# Patient Record
Sex: Male | Born: 1965 | Race: Black or African American | Hispanic: No | Smoking: Never smoker
Health system: Southern US, Community
[De-identification: ages and names within clinical notes are randomized; demographics above are authoritative.]

---

## 2008-04-05 ENCOUNTER — Emergency Department (HOSPITAL_COMMUNITY): Admission: EM | Admit: 2008-04-05 | Discharge: 2008-04-05 | Payer: Self-pay | Admitting: Emergency Medicine

## 2009-01-13 ENCOUNTER — Emergency Department (HOSPITAL_COMMUNITY): Admission: EM | Admit: 2009-01-13 | Discharge: 2009-01-14 | Payer: Self-pay | Admitting: *Deleted

## 2009-08-22 ENCOUNTER — Emergency Department (HOSPITAL_COMMUNITY): Admission: EM | Admit: 2009-08-22 | Discharge: 2009-08-22 | Payer: Self-pay | Admitting: Emergency Medicine

## 2009-11-09 ENCOUNTER — Emergency Department (HOSPITAL_COMMUNITY): Admission: EM | Admit: 2009-11-09 | Discharge: 2009-11-09 | Payer: Self-pay | Admitting: Emergency Medicine

## 2010-01-20 ENCOUNTER — Encounter (HOSPITAL_COMMUNITY): Admission: RE | Admit: 2010-01-20 | Discharge: 2010-02-19 | Payer: Self-pay | Admitting: Preventative Medicine

## 2010-03-29 IMAGING — CR DG NECK SOFT TISSUE
1 series · 1 of 1 positions shown · non-contrast
Comparison: None

CLINICAL DATA: Sensation of object within throat for several weeks.

NECK SOFT TISSUES - 1+ VIEW

[w soft tissue neck]
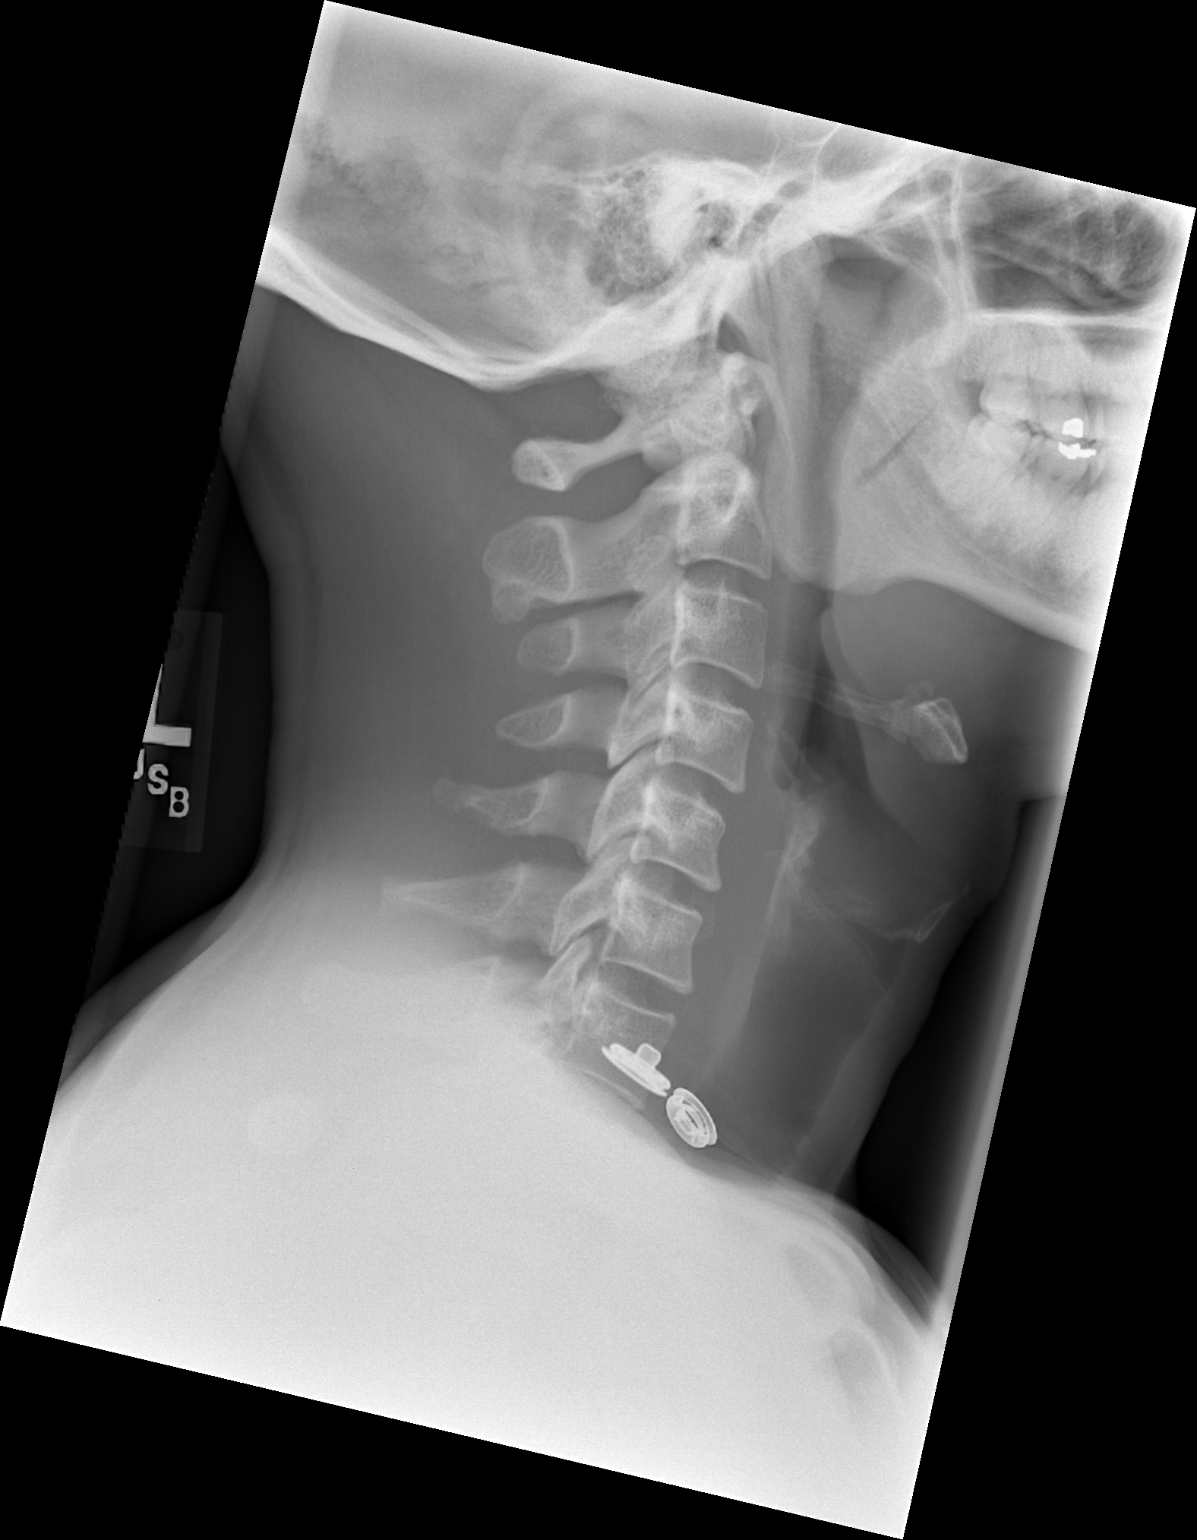

[1 of 1 positions shown; findings below may reference images not displayed]

FINDINGS: No radiopaque foreign body is identified.  The oropharynx
appears narrowed, although this may reflect transient change
secondary to swallowing.  The prevertebral soft tissues are within
normal limits.  The proximal trachea is unremarkable.  No definite
soft tissue abnormalities are seen.

The visualized cervical spine is unremarkable in appearance.  The
visualized paranasal sinuses and mastoid air cells are well-
aerated.
IMPRESSION: No radiopaque foreign bodies seen; apparent narrowing of the
oropharynx may be transient in nature.

## 2011-02-15 LAB — URINALYSIS, ROUTINE W REFLEX MICROSCOPIC
Bilirubin Urine: NEGATIVE
Glucose, UA: NEGATIVE mg/dL
Ketones, ur: NEGATIVE mg/dL
Protein, ur: NEGATIVE mg/dL
Specific Gravity, Urine: 1.019 (ref 1.005–1.030)
pH: 7.5 (ref 5.0–8.0)

## 2011-02-15 LAB — GC/CHLAMYDIA PROBE AMP, GENITAL: GC Probe Amp, Genital: POSITIVE — AB

## 2011-02-15 LAB — URINE MICROSCOPIC-ADD ON

## 2013-02-20 ENCOUNTER — Encounter (HOSPITAL_COMMUNITY): Payer: Self-pay | Admitting: Emergency Medicine

## 2013-02-20 ENCOUNTER — Emergency Department (INDEPENDENT_AMBULATORY_CARE_PROVIDER_SITE_OTHER)
Admission: EM | Admit: 2013-02-20 | Discharge: 2013-02-20 | Disposition: A | Discharge status: Home / Self Care | Payer: Commercial Managed Care - PPO | Source: Home / Self Care | Attending: Emergency Medicine | Admitting: Emergency Medicine

## 2013-02-20 DIAGNOSIS — R51 Headache: Secondary | ICD-10-CM

## 2013-02-20 MED ORDER — DIPHENHYDRAMINE HCL 50 MG/ML IJ SOLN
50.0000 mg | Freq: Once | INTRAMUSCULAR | Status: AC
  Administered 2013-02-20: 50 mg via INTRAMUSCULAR

## 2013-02-20 MED ORDER — KETOROLAC TROMETHAMINE 30 MG/ML IJ SOLN
30.0000 mg | Freq: Once | INTRAMUSCULAR | Status: AC
  Administered 2013-02-20: 30 mg via INTRAMUSCULAR

## 2013-02-20 MED ORDER — DIPHENHYDRAMINE HCL 50 MG/ML IJ SOLN
INTRAMUSCULAR | Status: AC
  Filled 2013-02-20: qty 1

## 2013-02-20 MED ORDER — KETOROLAC TROMETHAMINE 30 MG/ML IJ SOLN
INTRAMUSCULAR | Status: AC
  Filled 2013-02-20: qty 1

## 2013-02-20 MED ORDER — DICLOFENAC SODIUM 1 % TD GEL: TRANSDERMAL | Status: AC

## 2013-02-20 NOTE — ED Provider Notes (Signed)
History     CSN: [id number]  Arrival date & time 02/20/13  1722   First MD Initiated Contact with Patient 02/20/13 1729      Chief Complaint  Patient presents with  . Headache    (Consider location/radiation/quality/duration/timing/severity/associated sxs/prior treatment) HPI Comments: 47 year old African male complaining of headache located in the by frontal areas just above the temples. For the past 3 days it has been persistent and he rates it 9/10. He has headaches at least 2-3 times per my for the past 10 years. He has never been medically evaluated. Headaches are worse if he does not eat breakfast, does not sleep well or if her situation bothersome. He denies focal paresthesias or weakness. Denies problems with vision, speech, hearing or swallowing. He has had a cough for the past couple of days associated with mild PND.   History reviewed. No pertinent past medical history.  History reviewed. No pertinent past surgical history.  No family history on file.  History  Substance Use Topics  . Smoking status: Never Smoker   . Smokeless tobacco: Not on file  . Alcohol Use: No      Review of Systems  Constitutional: Negative.   HENT: Positive for postnasal drip. Negative for hearing loss, ear pain, sore throat, rhinorrhea, mouth sores, trouble swallowing and neck pain.   Eyes: Negative.   Respiratory: Negative.   Cardiovascular: Negative.   Gastrointestinal: Negative.   Musculoskeletal: Negative.   Skin: Negative.   Neurological: Positive for headaches. Negative for tremors, seizures, syncope, facial asymmetry, speech difficulty, weakness and numbness.    Allergies  Review of patient's allergies indicates no known allergies.  Home Medications   Current Outpatient Rx  Name  Route  Sig  Dispense  Refill  . acetaminophen (TYLENOL) 325 MG tablet   Oral   Take 650 mg by mouth every 6 (six) hours as needed for pain.         Marland Kitchen diclofenac sodium (VOLTAREN) 1 % GEL     Apply small amount of gel to the areas of headache pain.   100 g   0     BP 121/85  Pulse 67  Temp(Src) 98.4 F (36.9 C) (Oral)  Resp 19  SpO2 99%  Physical Exam  Nursing note and vitals reviewed. Constitutional: He is oriented to person, place, and time. He appears well-developed and well-nourished. No distress.  HENT:  Head: Normocephalic and atraumatic.  Bilateral TMs normal Oropharynx without erythema or exudates. Normal swallowing reflex.  Eyes: Conjunctivae and EOM are normal. Pupils are equal, round, and reactive to light.  Neck: Normal range of motion. Neck supple.  Cardiovascular: Normal rate, regular rhythm and normal heart sounds.   Pulmonary/Chest: Effort normal and breath sounds normal. No respiratory distress. He has no wheezes.  Musculoskeletal: Normal range of motion. He exhibits no edema and no tenderness.  Lymphadenopathy:    He has no cervical adenopathy.  Neurological: He is alert and oriented to person, place, and time. He displays no tremor. No cranial nerve deficit or sensory deficit. He exhibits normal muscle tone. He displays a negative Romberg sign. Coordination and gait normal. GCS eye subscore is 4. GCS verbal subscore is 5. GCS motor subscore is 6.  Skin: Skin is warm and dry.  Psychiatric: He has a normal mood and affect.    ED Course  Procedures (including critical care time)  Labs Reviewed - No data to display No results found.   1. Headache  MDM  The patient's physical exam is unremarkable. There is tenderness over the bifrontal areas of the scalp. I suspect this may be a tension type headache. There are no neurologic signs, symptoms or deficits. Toradol 30 mg IM and Benadryl 50 mg IM. Diclofenac gel apply small amount to the area of pain on the fore head during headache. Needs to locate a primary care physician for a evaluation. May use [employer] to locate a local physician. Per any new symptoms problems or worsening may return  or go to emergency department.        Hayden Rasmussen, NP 02/20/13 [phone]

## 2013-02-20 NOTE — ED Notes (Signed)
Headache for 3 days, history of the same.  Reports cough and throat congestion, not a sore throat, but unable to move congestion.  Denies ear pain.  No fever

## 2013-02-20 NOTE — ED Provider Notes (Signed)
Medical screening examination/treatment/procedure(s) were performed by non-physician practitioner and as supervising physician I was immediately available for consultation/collaboration.  Leslee Home, M.D.  Reuben Likes, MD 02/20/13 [phone]

## 2013-02-21 ENCOUNTER — Telehealth (HOSPITAL_COMMUNITY): Payer: Self-pay | Admitting: Emergency Medicine

## 2013-02-21 NOTE — ED Notes (Signed)
Pharmacy called in regards to Rx Voltaren Gel; needed grams pt is to apply and how freq Per Hayden Rasmussen, NP... Pt to apply 1-2 grams prn headache

## 2013-02-21 NOTE — ED Notes (Signed)
Pharmacy called stating Voltaren gel needs pre-authorization.  We can not perform that service here.  Chart reviewed by Hayden Rasmussen NP.   He stated patient could try OTC Salonpas.  Pharmacy made aware

## 2014-08-16 ENCOUNTER — Encounter (HOSPITAL_COMMUNITY): Payer: Self-pay | Admitting: Emergency Medicine

## 2014-08-16 ENCOUNTER — Emergency Department (INDEPENDENT_AMBULATORY_CARE_PROVIDER_SITE_OTHER)
Admission: EM | Admit: 2014-08-16 | Discharge: 2014-08-16 | Disposition: A | Discharge status: Home / Self Care | Payer: Self-pay | Source: Home / Self Care | Attending: Family Medicine | Admitting: Family Medicine

## 2014-08-16 DIAGNOSIS — M549 Dorsalgia, unspecified: Secondary | ICD-10-CM

## 2014-08-16 DIAGNOSIS — Z041 Encounter for examination and observation following transport accident: Secondary | ICD-10-CM

## 2014-08-16 MED ORDER — CYCLOBENZAPRINE HCL 5 MG PO TABS: 5.0000 mg | ORAL_TABLET | Freq: Three times a day (TID) | ORAL | Status: AC | PRN
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
## 2020-05-18 ENCOUNTER — Ambulatory Visit: Payer: Commercial Managed Care - PPO | Attending: Internal Medicine

## 2020-05-18 DIAGNOSIS — Z20822 Contact with and (suspected) exposure to covid-19: Secondary | ICD-10-CM

## 2020-05-19 LAB — SARS-COV-2, NAA 2 DAY TAT

## 2020-05-19 LAB — NOVEL CORONAVIRUS, NAA: SARS-CoV-2, NAA: NOT DETECTED
